# Patient Record
Sex: Male | Born: 1945 | Race: White | Hispanic: No | Marital: Married | Smoking: Former smoker
Health system: Southern US, Community
[De-identification: ages and names within clinical notes are randomized; demographics above are authoritative.]

## PROBLEM LIST (undated history)

## (undated) DIAGNOSIS — G4730 Sleep apnea, unspecified: Secondary | ICD-10-CM

## (undated) DIAGNOSIS — I10 Essential (primary) hypertension: Secondary | ICD-10-CM

---

## 2011-04-30 ENCOUNTER — Ambulatory Visit
Admission: RE | Admit: 2011-04-30 | Discharge: 2011-04-30 | Disposition: A | Discharge status: Home / Self Care | Payer: Medicare Other | Source: Ambulatory Visit | Attending: Internal Medicine | Admitting: Internal Medicine

## 2011-04-30 ENCOUNTER — Other Ambulatory Visit: Payer: Self-pay | Admitting: Internal Medicine

## 2011-04-30 DIAGNOSIS — Z136 Encounter for screening for cardiovascular disorders: Secondary | ICD-10-CM

## 2015-03-29 ENCOUNTER — Emergency Department (HOSPITAL_COMMUNITY): Payer: Commercial Managed Care - HMO

## 2015-03-29 ENCOUNTER — Encounter (HOSPITAL_COMMUNITY): Payer: Self-pay | Admitting: *Deleted

## 2015-03-29 ENCOUNTER — Emergency Department (HOSPITAL_COMMUNITY)
Admission: EM | Admit: 2015-03-29 | Discharge: 2015-03-30 | Disposition: A | Discharge status: Home / Self Care | Payer: Commercial Managed Care - HMO | Attending: Emergency Medicine | Admitting: Emergency Medicine

## 2015-03-29 DIAGNOSIS — Z8669 Personal history of other diseases of the nervous system and sense organs: Secondary | ICD-10-CM | POA: Insufficient documentation

## 2015-03-29 DIAGNOSIS — R110 Nausea: Secondary | ICD-10-CM | POA: Diagnosis not present

## 2015-03-29 DIAGNOSIS — R630 Anorexia: Secondary | ICD-10-CM | POA: Diagnosis not present

## 2015-03-29 DIAGNOSIS — R531 Weakness: Secondary | ICD-10-CM | POA: Insufficient documentation

## 2015-03-29 DIAGNOSIS — M791 Myalgia: Secondary | ICD-10-CM | POA: Insufficient documentation

## 2015-03-29 DIAGNOSIS — R509 Fever, unspecified: Secondary | ICD-10-CM | POA: Diagnosis not present

## 2015-03-29 DIAGNOSIS — I10 Essential (primary) hypertension: Secondary | ICD-10-CM | POA: Diagnosis not present

## 2015-03-29 DIAGNOSIS — R197 Diarrhea, unspecified: Secondary | ICD-10-CM | POA: Insufficient documentation

## 2015-03-29 DIAGNOSIS — Z87891 Personal history of nicotine dependence: Secondary | ICD-10-CM | POA: Insufficient documentation

## 2015-03-29 HISTORY — DX: Essential (primary) hypertension: I10

## 2015-03-29 HISTORY — DX: Sleep apnea, unspecified: G47.30

## 2015-03-29 LAB — COMPREHENSIVE METABOLIC PANEL
ALT: 25 U/L (ref 17–63)
ANION GAP: 11 (ref 5–15)
AST: 34 U/L (ref 15–41)
Albumin: 4.3 g/dL (ref 3.5–5.0)
Alkaline Phosphatase: 72 U/L (ref 38–126)
BILIRUBIN TOTAL: 0.7 mg/dL (ref 0.3–1.2)
BUN: 18 mg/dL (ref 6–20)
CHLORIDE: 98 mmol/L — AB (ref 101–111)
CO2: 23 mmol/L (ref 22–32)
CREATININE: 1.32 mg/dL — AB (ref 0.61–1.24)
Calcium: 9.2 mg/dL (ref 8.9–10.3)
GFR, EST NON AFRICAN AMERICAN: 54 mL/min — AB (ref >60)
Glucose, Bld: 125 mg/dL — ABNORMAL HIGH (ref 70–99)
Potassium: 4.5 mmol/L (ref 3.5–5.1)
Sodium: 132 mmol/L — ABNORMAL LOW (ref 135–145)
Total Protein: 8 g/dL (ref 6.5–8.1)

## 2015-03-29 LAB — CBC WITH DIFFERENTIAL/PLATELET
Basophils Absolute: 0 10*3/uL (ref 0.0–0.1)
Basophils Relative: 0 % (ref 0–1)
EOS PCT: 0 % (ref 0–5)
Eosinophils Absolute: 0 10*3/uL (ref 0.0–0.7)
HEMATOCRIT: 47.7 % (ref 39.0–52.0)
Hemoglobin: 16.2 g/dL (ref 13.0–17.0)
LYMPHS ABS: 0.6 10*3/uL — AB (ref 0.7–4.0)
LYMPHS PCT: 5 % — AB (ref 12–46)
MCH: 31.1 pg (ref 26.0–34.0)
MCHC: 34 g/dL (ref 30.0–36.0)
MCV: 91.6 fL (ref 78.0–100.0)
MONO ABS: 1 10*3/uL (ref 0.1–1.0)
MONOS PCT: 8 % (ref 3–12)
Neutro Abs: 11.2 10*3/uL — ABNORMAL HIGH (ref 1.7–7.7)
Neutrophils Relative %: 87 % — ABNORMAL HIGH (ref 43–77)
Platelets: 220 10*3/uL (ref 150–400)
RBC: 5.21 MIL/uL (ref 4.22–5.81)
RDW: 13 % (ref 11.5–15.5)
WBC: 12.8 10*3/uL — AB (ref 4.0–10.5)

## 2015-03-29 LAB — URINALYSIS, ROUTINE W REFLEX MICROSCOPIC
Bilirubin Urine: NEGATIVE
GLUCOSE, UA: NEGATIVE mg/dL
Hgb urine dipstick: NEGATIVE
KETONES UR: NEGATIVE mg/dL
LEUKOCYTES UA: NEGATIVE
Nitrite: NEGATIVE
PROTEIN: NEGATIVE mg/dL
Specific Gravity, Urine: 1.028 (ref 1.005–1.030)
UROBILINOGEN UA: 0.2 mg/dL (ref 0.0–1.0)
pH: 5.5 (ref 5.0–8.0)

## 2015-03-29 MED ORDER — IOHEXOL 300 MG/ML  SOLN
100.0000 mL | Freq: Once | INTRAMUSCULAR | Status: AC | PRN
Start: 2015-03-29 — End: 2015-03-29
  Administered 2015-03-29: 100 mL via INTRAVENOUS

## 2015-03-29 MED ORDER — SODIUM CHLORIDE 0.9 % IV SOLN: 1000.0000 mL | INTRAVENOUS | Status: DC

## 2015-03-29 MED ORDER — IOHEXOL 300 MG/ML  SOLN
25.0000 mL | Freq: Once | INTRAMUSCULAR | Status: AC | PRN
  Administered 2015-03-29: 25 mL via ORAL

## 2015-03-29 MED ORDER — SODIUM CHLORIDE 0.9 % IV SOLN
1000.0000 mL | Freq: Once | INTRAVENOUS | Status: AC
  Administered 2015-03-29: 1000 mL via INTRAVENOUS

## 2015-03-29 NOTE — ED Provider Notes (Signed)
CSN: [id number]     Arrival date & time 03/29/15  2010 History   First MD Initiated Contact with Patient 03/29/15 2204     Chief Complaint  Patient presents with  . Generalized Body Aches  . Diarrhea  . Nausea   HPI He started having heavy diarrhea two days ago.  His appetite has also been decreased and he has not been eating much.  Today he started to have nausea.  He started to feel progressively weak and he came to the ED.  He felt chilled at home, he did not take a temp but he thought he might have a fever.  No travel.  No recent abx.  NO abdominal pain.  No cough.  No dysuria.  Past Medical History  Diagnosis Date  . Hypertension   . Sleep apnea    History reviewed. No pertinent past surgical history. History reviewed. No pertinent family history. History  Substance Use Topics  . Smoking status: Former Games developermoker  . Smokeless tobacco: Never Used  . Alcohol Use: Yes    Review of Systems  All other systems reviewed and are negative.     Allergies  Review of patient's allergies indicates no known allergies.  Home Medications   Prior to Admission medications   Not on File   BP 110/55 mmHg  Pulse 99  Temp(Src) 101.3 F (38.5 C) (Oral)  Resp 22  Ht 5\' 10"  (1.778 m)  Wt 234 lb (106.142 kg)  BMI 33.58 kg/m2  SpO2 93% Physical Exam  Constitutional: He appears well-developed and well-nourished. No distress.  HENT:  Head: Normocephalic and atraumatic.  Right Ear: External ear normal.  Left Ear: External ear normal.  Mouth/Throat: Oropharyngeal exudate (mm dry) present.  Eyes: Conjunctivae are normal. Right eye exhibits no discharge. Left eye exhibits no discharge. No scleral icterus.  Neck: Neck supple. No tracheal deviation present.  Cardiovascular: Normal rate, regular rhythm and intact distal pulses.   Pulmonary/Chest: Effort normal and breath sounds normal. No stridor. No respiratory distress. He has no wheezes. He has no rales.  Abdominal: Soft. Bowel sounds are  normal. He exhibits no distension. There is no tenderness. There is no rebound and no guarding.  Musculoskeletal: He exhibits no edema or tenderness.  Neurological: He is alert. He has normal strength. No cranial nerve deficit (no facial droop, extraocular movements intact, no slurred speech) or sensory deficit. He exhibits normal muscle tone. He displays no seizure activity. Coordination normal.  Skin: Skin is warm and dry. No rash noted.  Psychiatric: He has a normal mood and affect.  Nursing note and vitals reviewed.   ED Course  Procedures (including critical care time) Labs Review Labs Reviewed  CBC WITH DIFFERENTIAL/PLATELET - Abnormal; Notable for the following:    WBC 12.8 (*)    Neutrophils Relative % 87 (*)    Neutro Abs 11.2 (*)    Lymphocytes Relative 5 (*)    Lymphs Abs 0.6 (*)    All other components within normal limits  COMPREHENSIVE METABOLIC PANEL - Abnormal; Notable for the following:    Sodium 132 (*)    Chloride 98 (*)    Glucose, Bld 125 (*)    Creatinine, Ser 1.32 (*)    GFR calc non Af Amer 54 (*)    All other components within normal limits  URINALYSIS, ROUTINE W REFLEX MICROSCOPIC - Abnormal; Notable for the following:    APPearance CLOUDY (*)    All other components within normal limits  Imaging Review Dg Chest 2 View  03/29/2015   CLINICAL DATA:  Nausea and diarrhea for 3 days  EXAM: CHEST  2 VIEW  COMPARISON:  None.  FINDINGS: There is a prominent hiatal hernia the lungs are clear. There are no effusions. Pulmonary vasculature is normal. Hilar and mediastinal contours appear unremarkable.  IMPRESSION: Hiatal hernia.   Electronically Signed   By: Ellery Plunkaniel R Mitchell M.D.   On: 03/29/2015 23:36   Ct Abdomen Pelvis W Contrast  03/30/2015   CLINICAL DATA:  Generalized body aches and diarrhea.  EXAM: CT ABDOMEN AND PELVIS WITH CONTRAST  TECHNIQUE: Multidetector CT imaging of the abdomen and pelvis was performed using the standard protocol following bolus  administration of intravenous contrast.  CONTRAST:  100mL OMNIPAQUE IOHEXOL 300 MG/ML  SOLN  COMPARISON:  None.  FINDINGS: There is mild distention of the colon with liquid stool. Small bowel is unremarkable. There is no bowel obstruction. There is no extraluminal air. There are no focal inflammatory changes. There is a moderately large hiatal hernia. The appendix is normal.  There is mild hepatic fatty infiltration without focal liver lesion. There is no bile duct dilatation. There are multiple calculi within the gallbladder lumen measuring up to 6 mm.  The spleen, pancreas, adrenals and kidneys are remarkable only for benign renal cysts measuring up to 3.6 cm on the left.  The abdominal aorta is normal in caliber.  There is no adenopathy. There is no ascites. There is no significant abnormality in the lower chest. No significant musculoskeletal abnormality is evident. There is severe facet arthritis at L5-S1 bilaterally.  IMPRESSION: *Large volume liquid stool in the colon consistent with diarrhea. *Negative for bowel obstruction, perforation or focal inflammatory change. *Large hiatal hernia *Fatty liver *Cholelithiasis *Severe lumbosacral facet arthritis   Electronically Signed   By: Ellery Plunkaniel R Mitchell M.D.   On: 03/30/2015 00:27    Medications  0.9 %  sodium chloride infusion (0 mLs Intravenous Stopped 03/30/15 0037)    Followed by  0.9 %  sodium chloride infusion (not administered)  iohexol (OMNIPAQUE) 300 MG/ML solution 25 mL (25 mLs Oral Contrast Given 03/29/15 2314)  iohexol (OMNIPAQUE) 300 MG/ML solution 100 mL (100 mLs Intravenous Contrast Given 03/29/15 2352)     MDM   Final diagnoses:  Diarrhea  Fever   patient has not been hospitalized recently. He has not been on any oral antibiotics. He does not have any risk factors for Clostridium difficile but I am Concerned considering his age and the fever.  I discussed supportive care and close follow-up with his primary doctor. I gave him a  prescription for Flagyl but  suggested he try and see if the symptoms resolve in the next 24 to 48 hours before starting that medication  At this time there does not appear to be any evidence of an acute emergency medical condition and the patient appears stable for discharge with appropriate outpatient follow up.     Linwood DibblesJon Breelynn Bankert, MD 03/30/15 0100

## 2015-03-29 NOTE — ED Notes (Signed)
EDP aware of O2 sat in low 90s.

## 2015-03-29 NOTE — ED Notes (Signed)
Pt c/o generalized body aches, diarrhea, chills and nausea since Monday.

## 2015-03-29 NOTE — ED Notes (Signed)
Patient transported to X-ray 

## 2015-03-30 MED ORDER — METRONIDAZOLE 500 MG PO TABS: 500.0000 mg | ORAL_TABLET | Freq: Three times a day (TID) | ORAL | Status: AC

## 2015-03-30 NOTE — Discharge Instructions (Signed)

## 2015-03-30 NOTE — ED Notes (Signed)
The patient is back to E-48 from CT and X-ray.

## 2015-03-30 NOTE — ED Notes (Signed)
Patient is alert and orientedx4.  Patient was explained discharge instructions and they understood them with no questions.  The patient's wife, Matthew Alexander is taking the patient home,

## 2016-03-28 IMAGING — CT CT ABD-PELV W/ CM
2 of 5 series · 16 of 46 positions shown, 18 images · IV contrast (APPLIED)
Comparison: None.

CLINICAL DATA: Generalized body aches and diarrhea.

EXAM:
CT ABDOMEN AND PELVIS WITH CONTRAST
TECHNIQUE: Multidetector CT imaging of the abdomen and pelvis was performed
using the standard protocol following bolus administration of
intravenous contrast.
CONTRAST:  100mL OMNIPAQUE IOHEXOL 300 MG/ML  SOLN

[Series 2: abd/ pelvis 5.0 i30f 1 · axial · 0.89mm/px · z∈[-508,-23]mm · 13 of 109 slices shown, 15 images]
[im 6/109  soft-tissue]
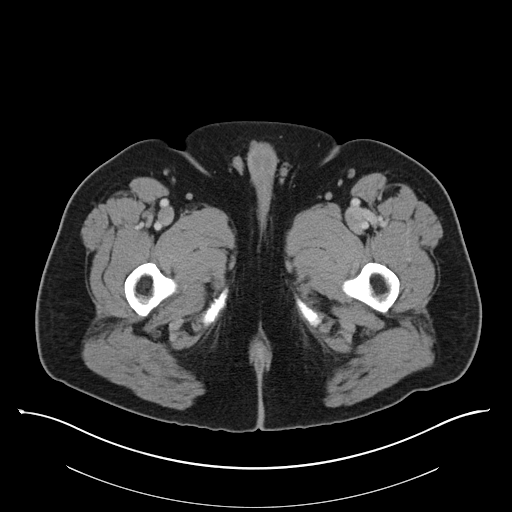
[im 6/109  bone]
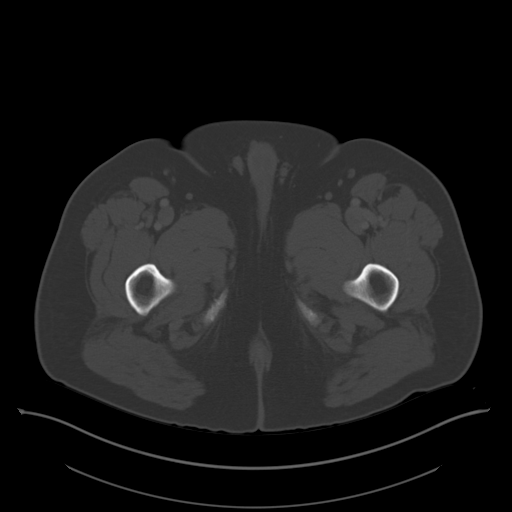
[im 18/109  soft-tissue]
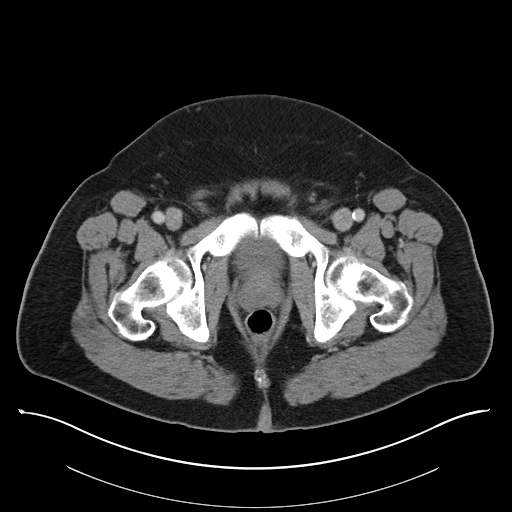
[im 23/109  soft-tissue]
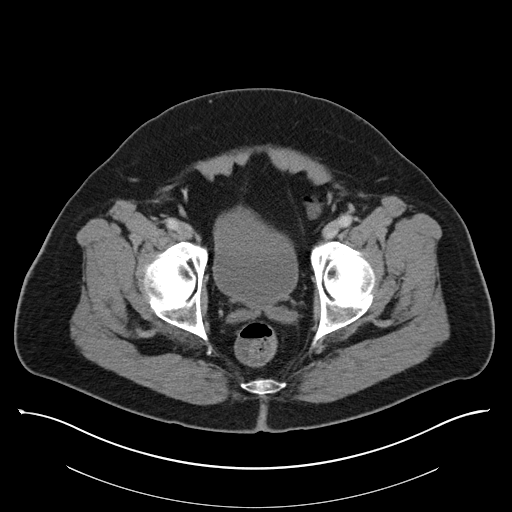
[im 29/109  soft-tissue]
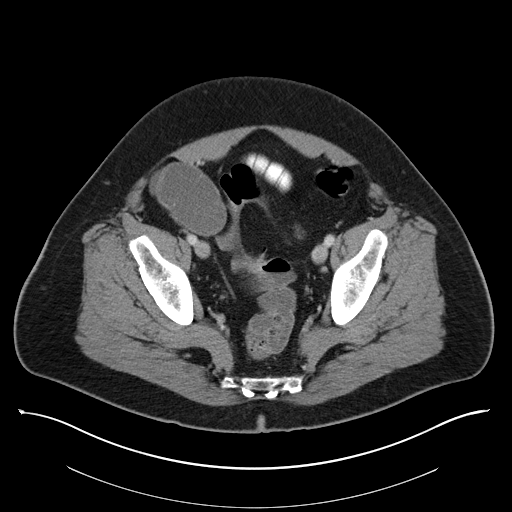
[im 40/109  soft-tissue]
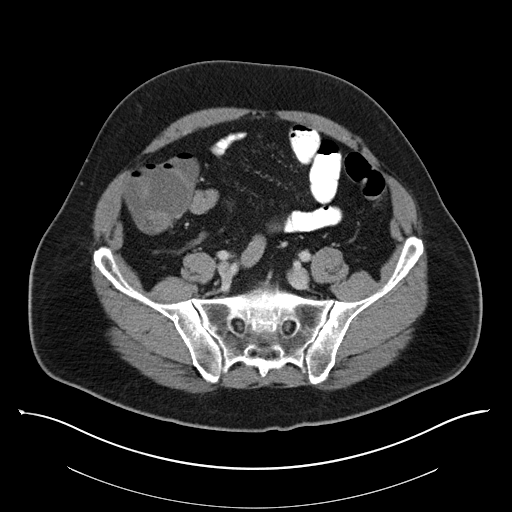
[im 46/109  soft-tissue]
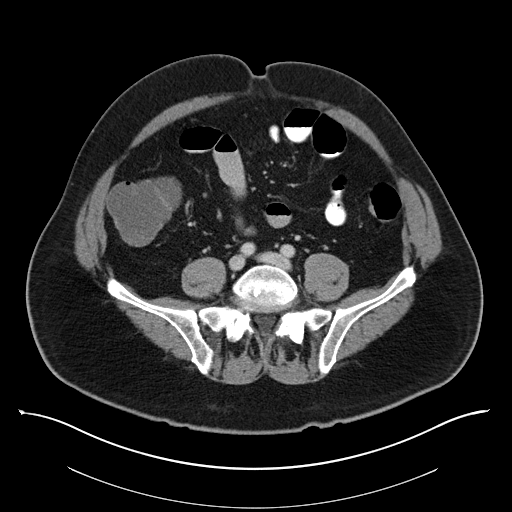
[im 57/109  soft-tissue]
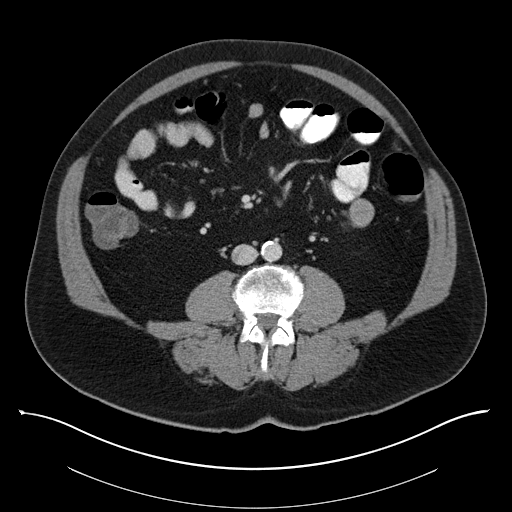
[im 63/109  soft-tissue]
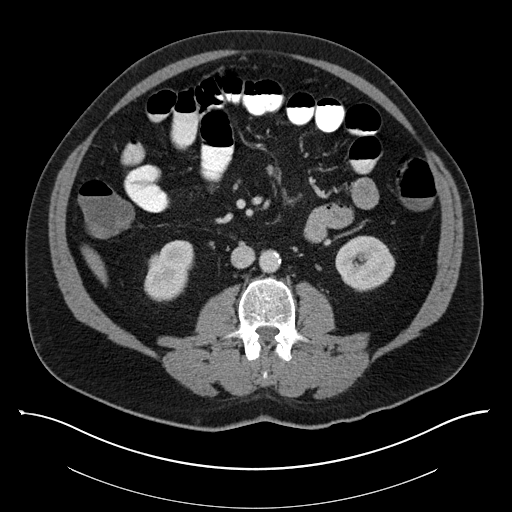
[im 69/109  soft-tissue]
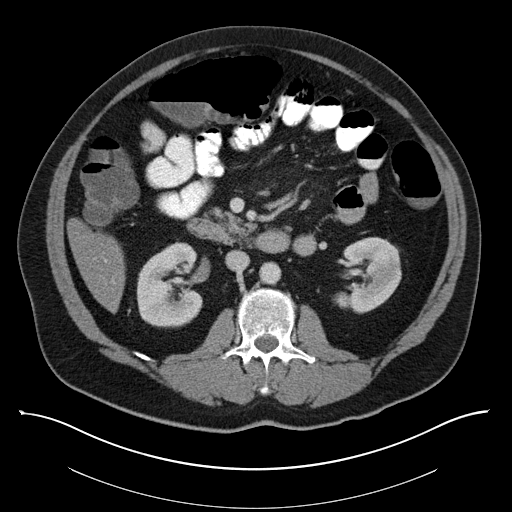
[im 69/109  bone]
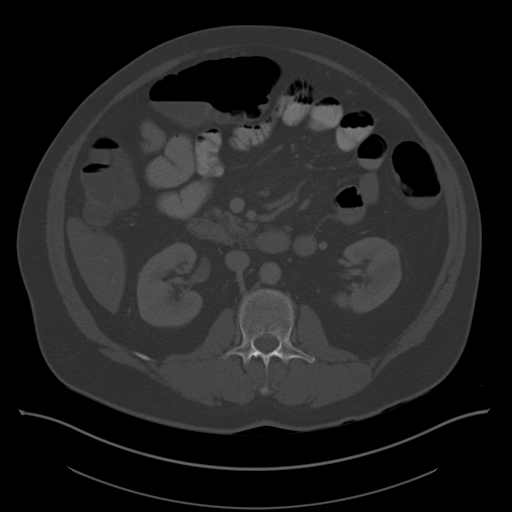
[im 80/109  soft-tissue]
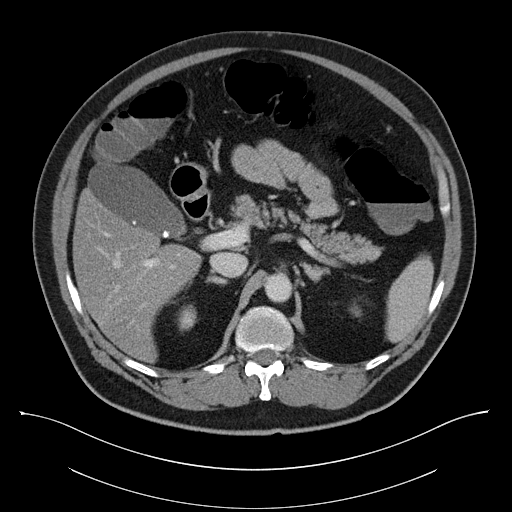
[im 86/109  soft-tissue]
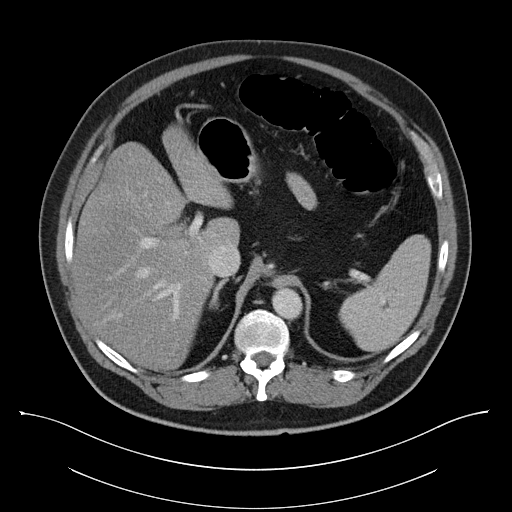
[im 91/109  soft-tissue]
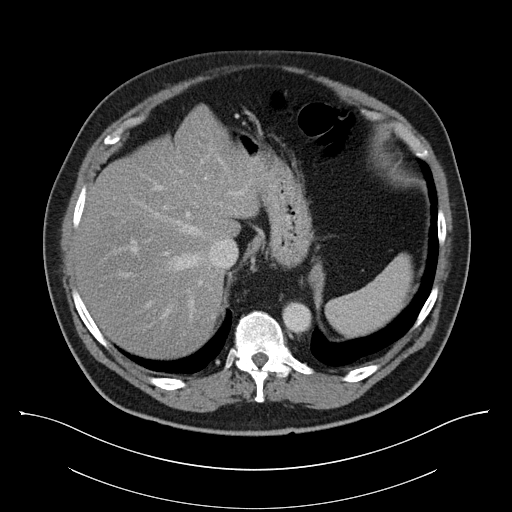
[im 103/109  soft-tissue]
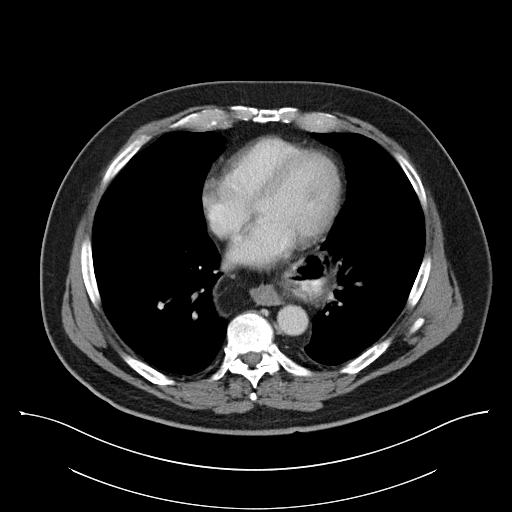

[Series 5: coronal soft tissue · coronal · 0.87mm/px · 3 of 119 slices shown]
[im 40/119  soft-tissue]
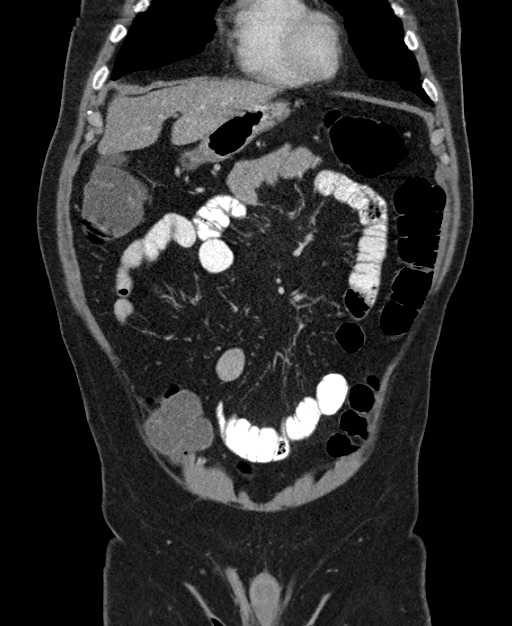
[im 53/119  soft-tissue]
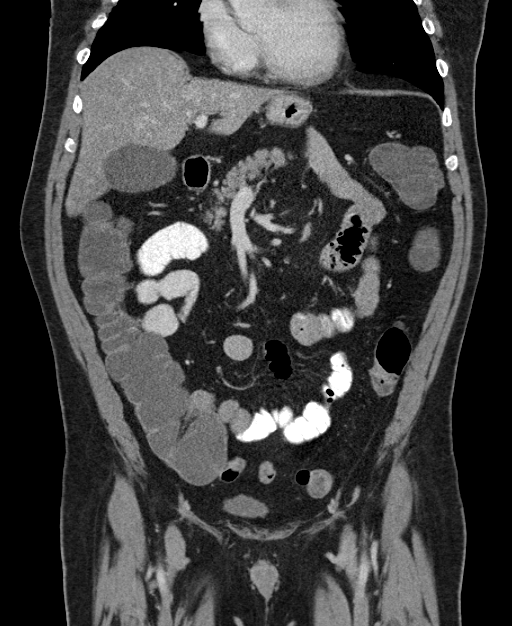
[im 66/119  soft-tissue]
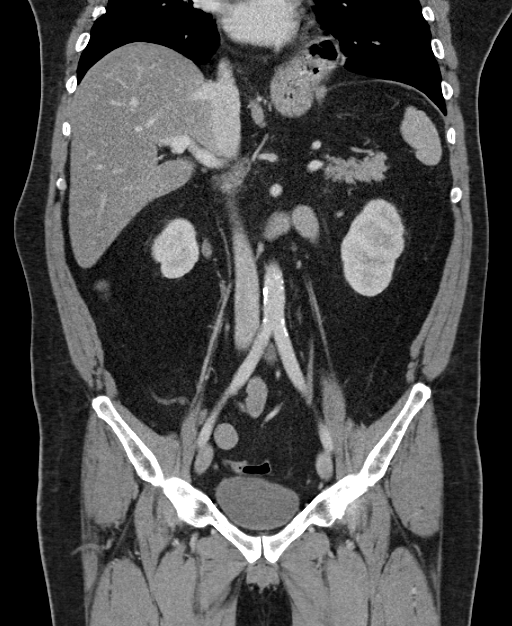

[16 of 46 positions shown; findings below may reference images not displayed]

FINDINGS: There is mild distention of the colon with liquid stool. Small bowel
is unremarkable. There is no bowel obstruction. There is no
extraluminal air. There are no focal inflammatory changes. There is
a moderately large hiatal hernia. The appendix is normal.

There is mild hepatic fatty infiltration without focal liver lesion.
There is no bile duct dilatation. There are multiple calculi within
the gallbladder lumen measuring up to 6 mm.

The spleen, pancreas, adrenals and kidneys are remarkable only for
benign renal cysts measuring up to 3.6 cm on the left.

The abdominal aorta is normal in caliber.

There is no adenopathy. There is no ascites. There is no significant
abnormality in the lower chest. No significant musculoskeletal
abnormality is evident. There is severe facet arthritis at L5-S1
bilaterally.
IMPRESSION: *Large volume liquid stool in the colon consistent with diarrhea.
*Negative for bowel obstruction, perforation or focal inflammatory
change.
*Large hiatal hernia
*Fatty liver
*Cholelithiasis
*Severe lumbosacral facet arthritis

## 2017-03-11 ENCOUNTER — Ambulatory Visit (INDEPENDENT_AMBULATORY_CARE_PROVIDER_SITE_OTHER): Payer: Medicare Other | Admitting: Sports Medicine

## 2017-03-11 ENCOUNTER — Encounter: Payer: Self-pay | Admitting: Sports Medicine

## 2017-03-11 DIAGNOSIS — B351 Tinea unguium: Secondary | ICD-10-CM | POA: Diagnosis not present

## 2017-03-11 DIAGNOSIS — M79675 Pain in left toe(s): Secondary | ICD-10-CM

## 2017-03-11 DIAGNOSIS — L600 Ingrowing nail: Secondary | ICD-10-CM

## 2017-03-11 NOTE — Patient Instructions (Signed)

## 2017-03-11 NOTE — Progress Notes (Signed)
Subjective: Matthew Alexander is a 71 y.o. male patient presents to office today complaining of a painful incurvated, red, hot, swollen medial nail border of the 1st toe on the left foot. This has been present for 6 months. Patient has treated this by self trimming. Patient denies fever/chills/nausea/vomitting/any other related constitutional symptoms at this time.  There are no active problems to display for this patient.   Current Outpatient Prescriptions on File Prior to Visit  Medication Sig Dispense Refill  . metroNIDAZOLE (FLAGYL) 500 MG tablet Take 1 tablet (500 mg total) by mouth 3 (three) times daily. 30 tablet 0   No current facility-administered medications on file prior to visit.     No Known Allergies  Objective:  There were no vitals filed for this visit.  General: Well developed, nourished, in no acute distress, alert and oriented x3   Dermatology: Skin is warm, dry and supple bilateral. Left hallux nail appears to be severely incurvated with hyperkeratosis formation at the distal aspects of the medial nail border with distal mycosis. (+) Erythema. (+) Edema. (-) serosanguous drainage present. The remaining nails appear unremarkable at this time. There are no open sores, lesions or other signs of infection  present.  Vascular: Dorsalis Pedis artery and Posterior Tibial artery pedal pulses are 2/4 bilateral with immedate capillary fill time. Pedal hair growth present. No lower extremity edema.   Neruologic: Grossly intact via light touch bilateral.  Musculoskeletal: Tenderness to palpation of the Left hallux medial nail fold(s). Muscular strength within normal limits in all groups bilateral.   Assesement and Plan: Problem List Items Addressed This Visit    None    Visit Diagnoses    Ingrown nail    -  Primary   Toe pain, left       Onychomycosis          -Discussed treatment alternatives and plan of care; Explained permanent/temporary nail avulsion and post  procedure course to patient. - After a verbal consent, injected 3 ml of a 50:50 mixture of 2% plain  lidocaine and 0.5% plain marcaine in a normal hallux block fashion. Next, a betadine prep was performed. Anesthesia was tested and found to be appropriate. The offending left hallux medial nail border was then incised from the hyponychium to the epinychium. The offending nail border was removed and cleared from the field. The area was curretted for any remaining nail or spicules. Phenol application performed and the area was then flushed with alcohol and dressed with antibiotic cream and a dry sterile dressing. -Patient was instructed to leave the dressing intact for today and begin soaking in a weak solution of betadine or Epsom salt and water tomorrow. Patient was instructed to soak for 15 minutes each day and apply neosporin and a gauze or bandaid dressing each day. -Patient was instructed to monitor the toe for signs of infection and return to office if toe becomes red, hot or swollen. -Advised ice, elevation, and tylenol or motrin if needed for pain.  -Patient is to return in 2 weeks for follow up care/nail check or sooner if problems arise.  Asencion Islam, DPM

## 2017-03-13 ENCOUNTER — Telehealth: Payer: Self-pay | Admitting: *Deleted

## 2017-03-13 NOTE — Telephone Encounter (Addendum)
Pt states he has some questions about soaking after a toenail procedure with Dr. Marylene Land. Left message informing pt I would call again about the soaks. Pt states he might have misunderstood the directions or misread, but is he to pull and separate the scab from the toe. I told him that the soaks keep the scab soft and sloughing off, so as not to get a hard scab too early and trap possible infection or drainage at the surgery site. I told the pt to perform the soak that had been discussed with him for 4-6 weeks and antibiotic dressing after each soak, at the end of the 4th week perform the last soak of the day and leave off the antibiotic ointment and bandaid, allow to air dry, if the area gets a dry hard scab without redness, drainage, swelling or pain, then can stop the soaks and dressing. If continues to have the drainage, redness, swelling or pain continue another two weeks and test again. Pt states understanding and that he thought the staff at our center was pleasant and helpful, Dr. Marylene Land treated him like he was the only pt in the world.

## 2017-03-25 ENCOUNTER — Ambulatory Visit (INDEPENDENT_AMBULATORY_CARE_PROVIDER_SITE_OTHER): Payer: Self-pay | Admitting: Sports Medicine

## 2017-03-25 DIAGNOSIS — M79675 Pain in left toe(s): Secondary | ICD-10-CM

## 2017-03-25 DIAGNOSIS — Z9889 Other specified postprocedural states: Secondary | ICD-10-CM

## 2017-03-25 NOTE — Progress Notes (Signed)
Subjective: ARSENIO SCHNORR is a 71 y.o. male patient returns to office today for follow up evaluation after having Left Hallux medial permanent nail avulsion performed on 03-11-17. Patient has been soaking using betadine and applying topical antibiotic covered with bandaid daily. Patient deniesfever/chills/nausea/vomitting/any other related constitutional symptoms at this time.  There are no active problems to display for this patient.   Current Outpatient Prescriptions on File Prior to Visit  Medication Sig Dispense Refill  . buPROPion (WELLBUTRIN XL) 150 MG 24 hr tablet     . escitalopram (LEXAPRO) 10 MG tablet Take 10 mg by mouth daily.  5  . lisinopril (PRINIVIL,ZESTRIL) 40 MG tablet Take 40 mg by mouth daily.  5  . metroNIDAZOLE (FLAGYL) 500 MG tablet Take 1 tablet (500 mg total) by mouth 3 (three) times daily. 30 tablet 0  . propranolol (INDERAL) 40 MG tablet     . sertraline (ZOLOFT) 100 MG tablet Take 100 mg by mouth daily.  5   No current facility-administered medications on file prior to visit.     No Known Allergies  Objective:  General: Well developed, nourished, in no acute distress, alert and oriented x3   Dermatology: Skin is warm, dry and supple bilateral. Left hallux medial nail bed appears to be clean, dry, with mild granular tissue and surrounding eschar/scab. (-) Erythema. (-) Edema. (-) serosanguous drainage present. The remaining nails appear unremarkable at this time. There are no other lesions or other signs of infection  present.  Neurovascular status: Intact. No lower extremity swelling; No pain with calf compression bilateral.  Musculoskeletal: Decreased tenderness to palpation of the Left hallux medial nail fold. Muscular strength within normal limits bilateral.   Assesement and Plan: Problem List Items Addressed This Visit    None    Visit Diagnoses    S/P nail surgery    -  Primary   Toe pain, left          -Examined patient  -Cleansed left  hallux medial nail fold and gently scrubbed with peroxide and q-tip/curetted away eschar at site and applied antibiotic cream covered with bandaid.  -Discussed plan of care with patient. -Patient to now begin soaking in a weak solution of Epsom salt and warm water. Patient was instructed to soak for 15-20 minutes each day until the toe appears normal and there is no drainage, redness, tenderness, or swelling at the procedure site, and apply neosporin and a gauze or bandaid dressing each day as needed. May leave open to air at night. -Educated patient on long term care after nail surgery. -Patient was instructed to monitor the toe for reoccurrence and signs of infection; Patient advised to return to office or go to ER if toe becomes red, hot or swollen. -Patient is to return as needed or sooner if problems arise.  Asencion Islam, DPM

## 2020-01-06 ENCOUNTER — Ambulatory Visit: Payer: No Typology Code available for payment source

## 2020-01-22 ENCOUNTER — Ambulatory Visit: Payer: No Typology Code available for payment source

## 2020-09-22 ENCOUNTER — Ambulatory Visit: Payer: Medicare Other | Attending: Internal Medicine

## 2020-09-22 DIAGNOSIS — Z23 Encounter for immunization: Secondary | ICD-10-CM

## 2020-09-22 NOTE — Progress Notes (Signed)
   Covid-19 Vaccination Clinic  Name:  Matthew Alexander    MRN: [id number] DOB: 05/06/1946  09/22/2020  Mr. Matthew Alexander was observed post Covid-19 immunization for 15 minutes without incident. He was provided with Vaccine Information Sheet and instruction to access the V-Safe system.   Mr. Matthew Alexander was instructed to call 911 with any severe reactions post vaccine: Marland Kitchen Difficulty breathing  . Swelling of face and throat  . A fast heartbeat  . A bad rash all over body  . Dizziness and weakness

## 2022-09-13 ENCOUNTER — Other Ambulatory Visit (HOSPITAL_COMMUNITY): Payer: Self-pay

## 2022-09-13 ENCOUNTER — Other Ambulatory Visit (HOSPITAL_BASED_OUTPATIENT_CLINIC_OR_DEPARTMENT_OTHER): Payer: Self-pay

## 2022-09-13 MED ORDER — OZEMPIC (1 MG/DOSE) 4 MG/3ML ~~LOC~~ SOPN
1.0000 mg | PEN_INJECTOR | SUBCUTANEOUS | 4 refills | Status: DC
  Filled 2022-09-13: qty 3, 28d supply, fill #0
  Filled 2022-10-06: qty 3, 28d supply, fill #1
  Filled 2022-11-26: qty 3, 28d supply, fill #2
  Filled 2022-12-20: qty 3, 28d supply, fill #3
  Filled 2023-01-23: qty 3, 28d supply, fill #4

## 2022-10-07 ENCOUNTER — Other Ambulatory Visit (HOSPITAL_COMMUNITY): Payer: Self-pay

## 2022-10-08 ENCOUNTER — Other Ambulatory Visit (HOSPITAL_COMMUNITY): Payer: Self-pay

## 2023-01-28 ENCOUNTER — Other Ambulatory Visit (HOSPITAL_COMMUNITY): Payer: Self-pay

## 2023-01-28 MED ORDER — ONETOUCH VERIO VI STRP
ORAL_STRIP | 11 refills | Status: AC
  Filled 2023-01-28: qty 50, 25d supply, fill #0

## 2023-01-28 MED ORDER — ONETOUCH DELICA PLUS LANCET33G MISC
11 refills | Status: AC
  Filled 2023-01-28: qty 100, 50d supply, fill #0

## 2023-02-17 ENCOUNTER — Other Ambulatory Visit (HOSPITAL_COMMUNITY): Payer: Self-pay

## 2023-02-18 ENCOUNTER — Other Ambulatory Visit (HOSPITAL_COMMUNITY): Payer: Self-pay

## 2023-02-18 MED ORDER — OZEMPIC (1 MG/DOSE) 4 MG/3ML ~~LOC~~ SOPN
1.0000 mg | PEN_INJECTOR | SUBCUTANEOUS | 4 refills | Status: AC
  Filled 2023-02-20: qty 3, 28d supply, fill #0
  Filled 2023-03-15: qty 3, 28d supply, fill #1
  Filled 2023-04-16: qty 3, 28d supply, fill #2
  Filled 2023-06-10: qty 3, 28d supply, fill #3

## 2023-02-20 ENCOUNTER — Other Ambulatory Visit (HOSPITAL_COMMUNITY): Payer: Self-pay

## 2023-03-17 ENCOUNTER — Other Ambulatory Visit (HOSPITAL_COMMUNITY): Payer: Self-pay

## 2023-03-19 ENCOUNTER — Other Ambulatory Visit (HOSPITAL_COMMUNITY): Payer: Self-pay

## 2023-04-16 ENCOUNTER — Other Ambulatory Visit (HOSPITAL_COMMUNITY): Payer: Self-pay

## 2023-06-11 ENCOUNTER — Other Ambulatory Visit (HOSPITAL_COMMUNITY): Payer: Self-pay

## 2023-06-13 ENCOUNTER — Other Ambulatory Visit (HOSPITAL_COMMUNITY): Payer: Self-pay

## 2023-08-14 ENCOUNTER — Other Ambulatory Visit (HOSPITAL_COMMUNITY): Payer: Self-pay

## 2023-09-29 ENCOUNTER — Other Ambulatory Visit (HOSPITAL_COMMUNITY): Payer: Self-pay

## 2023-10-29 ENCOUNTER — Other Ambulatory Visit: Payer: Self-pay

## 2023-10-29 ENCOUNTER — Other Ambulatory Visit (HOSPITAL_COMMUNITY): Payer: Self-pay

## 2023-12-12 ENCOUNTER — Other Ambulatory Visit (HOSPITAL_COMMUNITY): Payer: Self-pay

## 2023-12-12 MED ORDER — OZEMPIC (1 MG/DOSE) 4 MG/3ML ~~LOC~~ SOPN
1.0000 mg | PEN_INJECTOR | SUBCUTANEOUS | 3 refills | Status: AC
  Filled 2023-12-12: qty 3, 28d supply, fill #0

## 2023-12-24 ENCOUNTER — Other Ambulatory Visit (HOSPITAL_COMMUNITY): Payer: Self-pay

## 2024-08-10 ENCOUNTER — Other Ambulatory Visit: Payer: Self-pay | Admitting: Medical Genetics

## 2024-10-28 ENCOUNTER — Other Ambulatory Visit (HOSPITAL_COMMUNITY)
